# Patient Record
Sex: Female | Born: 1980 | Race: Black or African American | Hispanic: No | Marital: Single | Smoking: Former smoker
Health system: Southern US, Community
[De-identification: ages and names within clinical notes are randomized; demographics above are authoritative.]

## PROBLEM LIST (undated history)

## (undated) DIAGNOSIS — R011 Cardiac murmur, unspecified: Secondary | ICD-10-CM

## (undated) DIAGNOSIS — Q369 Cleft lip, unilateral: Secondary | ICD-10-CM

## (undated) DIAGNOSIS — N390 Urinary tract infection, site not specified: Secondary | ICD-10-CM

## (undated) HISTORY — PX: ANKLE SURGERY: SHX546

## (undated) HISTORY — PX: RECTAL SURGERY: SHX760

## (undated) HISTORY — PX: MANDIBLE SURGERY: SHX707

## (undated) HISTORY — PX: FACIAL RECONSTRUCTION SURGERY: SHX631

## (undated) HISTORY — PX: CLEFT LIP REPAIR: SUR1164

---

## 2016-09-05 ENCOUNTER — Encounter: Payer: Self-pay | Admitting: Emergency Medicine

## 2016-09-05 ENCOUNTER — Emergency Department
Admission: EM | Admit: 2016-09-05 | Discharge: 2016-09-05 | Disposition: A | Discharge status: Home / Self Care | Payer: Self-pay | Attending: Emergency Medicine | Admitting: Emergency Medicine

## 2016-09-05 DIAGNOSIS — J029 Acute pharyngitis, unspecified: Secondary | ICD-10-CM | POA: Insufficient documentation

## 2016-09-05 DIAGNOSIS — F17200 Nicotine dependence, unspecified, uncomplicated: Secondary | ICD-10-CM | POA: Insufficient documentation

## 2016-09-05 DIAGNOSIS — R300 Dysuria: Secondary | ICD-10-CM | POA: Insufficient documentation

## 2016-09-05 DIAGNOSIS — R1030 Lower abdominal pain, unspecified: Secondary | ICD-10-CM | POA: Insufficient documentation

## 2016-09-05 HISTORY — DX: Cleft lip, unilateral: Q36.9

## 2016-09-05 HISTORY — DX: Cardiac murmur, unspecified: R01.1

## 2016-09-05 HISTORY — DX: Urinary tract infection, site not specified: N39.0

## 2016-09-05 LAB — CBC
HEMATOCRIT: 38.1 % (ref 35.0–47.0)
Hemoglobin: 13.1 g/dL (ref 12.0–16.0)
MCH: 30.6 pg (ref 26.0–34.0)
MCHC: 34.4 g/dL (ref 32.0–36.0)
MCV: 88.9 fL (ref 80.0–100.0)
Platelets: 288 10*3/uL (ref 150–440)
RBC: 4.29 MIL/uL (ref 3.80–5.20)
RDW: 12.8 % (ref 11.5–14.5)
WBC: 6.7 10*3/uL (ref 3.6–11.0)

## 2016-09-05 LAB — URINALYSIS, COMPLETE (UACMP) WITH MICROSCOPIC
BACTERIA UA: NONE SEEN
Bilirubin Urine: NEGATIVE
Glucose, UA: NEGATIVE mg/dL
Hgb urine dipstick: NEGATIVE
KETONES UR: NEGATIVE mg/dL
LEUKOCYTES UA: NEGATIVE
Nitrite: NEGATIVE
PH: 5 (ref 5.0–8.0)
Protein, ur: NEGATIVE mg/dL
Specific Gravity, Urine: 1.02 (ref 1.005–1.030)

## 2016-09-05 LAB — COMPREHENSIVE METABOLIC PANEL
ALT: 17 U/L (ref 14–54)
AST: 20 U/L (ref 15–41)
Albumin: 4.1 g/dL (ref 3.5–5.0)
Alkaline Phosphatase: 70 U/L (ref 38–126)
Anion gap: 5 (ref 5–15)
BUN: 11 mg/dL (ref 6–20)
CHLORIDE: 105 mmol/L (ref 101–111)
CO2: 29 mmol/L (ref 22–32)
Calcium: 9 mg/dL (ref 8.9–10.3)
Creatinine, Ser: 0.96 mg/dL (ref 0.44–1.00)
Glucose, Bld: 89 mg/dL (ref 65–99)
POTASSIUM: 4.1 mmol/L (ref 3.5–5.1)
Sodium: 139 mmol/L (ref 135–145)
Total Bilirubin: 0.3 mg/dL (ref 0.3–1.2)
Total Protein: 8 g/dL (ref 6.5–8.1)

## 2016-09-05 LAB — POCT RAPID STREP A: STREPTOCOCCUS, GROUP A SCREEN (DIRECT): NEGATIVE

## 2016-09-05 LAB — LIPASE, BLOOD: LIPASE: 28 U/L (ref 11–51)

## 2016-09-05 MED ORDER — PHENAZOPYRIDINE HCL 200 MG PO TABS: 200.0000 mg | ORAL_TABLET | Freq: Three times a day (TID) | ORAL | 0 refills | Status: AC | PRN

## 2016-09-05 NOTE — ED Triage Notes (Signed)
Pt comes into the ED via POV c/o lower abdominal pain and sore throat.  Patient completed antibiotics 4-5 days ago for recurrent UTI.  Patient has been having the abdominal pain since then.  States there is no discharge that she has noticed, but she has problems with odor.  States she has burning with urination.  Denies any worry for STD's at this time.  Patient in NAD at this time.

## 2016-09-05 NOTE — ED Provider Notes (Signed)
[facility] Emergency Department Provider Note  ____________________________________________  Time seen: Approximately 5:19 PM  I have reviewed the triage vital signs and the nursing notes.   HISTORY  Chief Complaint Abdominal Pain and Sore Throat   HPI Sabrina Luna is a 35 y.o. female with a history of recurrent urinary tract infections and presents for concern of urinary tract infection. Patient last took antibiotics and the beginning of November. For the last 3 days she noticed worsening dysuria and mild intermittent suprapubic cramping pain radiating to her lower back. The pain is only present when she urinates and according to her this how she feels every time she has a urinary tract infection. She has no pain at this time. No fever or chills, no nausea or vomiting, no chest pain or shortness of breath, no hematuria, no h/o kidney stones, no flank pain, no vaginal discharge. Patient also complaining of a sore throat that started earlier today. She hasn't tried anything at home for the pain. No congestion or body aches. No coughing.   Past Medical History:  Diagnosis Date  . Cleft lip   . Heart murmur   . Recurrent urinary tract infection     There are no active problems to display for this patient.   Past Surgical History:  Procedure Laterality Date  . ANKLE SURGERY    . CLEFT LIP REPAIR    . FACIAL RECONSTRUCTION SURGERY    . MANDIBLE SURGERY    . RECTAL SURGERY      Prior to Admission medications   Medication Sig Start Date End Date Taking? Authorizing Provider  phenazopyridine (PYRIDIUM) 200 MG tablet Take 1 tablet (200 mg total) by mouth 3 (three) times daily as needed for pain. 09/05/16 09/05/17  Nita Sicklearolina Jamirra Curnow, MD    Allergies Adhesive [tape]; Cherry; Latex; Penicillins; and Tomato  No family history on file.  Social History Social History  Substance Use Topics  . Smoking status: Current Some Day Smoker  . Smokeless tobacco:  Never Used  . Alcohol use Yes     Comment: occasional    Review of Systems  Constitutional: Negative for fever. Eyes: Negative for visual changes. ENT: + sore throat. Neck: No neck pain  Cardiovascular: Negative for chest pain. Respiratory: Negative for shortness of breath. Gastrointestinal: + suprapubic abdominal pain. No vomiting or diarrhea. Genitourinary: + dysuria. Musculoskeletal: Negative for back pain. Skin: Negative for rash. Neurological: Negative for headaches, weakness or numbness. Psych: No SI or HI  ____________________________________________   PHYSICAL EXAM:  VITAL SIGNS: ED Triage Vitals  Enc Vitals Group     BP 09/05/16 1628 120/66     Pulse Rate 09/05/16 1628 77     Resp 09/05/16 1628 16     Temp 09/05/16 1628 98.4 F (36.9 C)     Temp Source 09/05/16 1628 Oral     SpO2 09/05/16 1628 100 %     Weight 09/05/16 1629 210 lb (95.3 kg)     Height 09/05/16 1629 5' 2.5" (1.588 m)     Head Circumference --      Peak Flow --      Pain Score 09/05/16 1638 6     Pain Loc --      Pain Edu? --      Excl. in GC? --     Constitutional: Alert and oriented. Well appearing and in no apparent distress. HEENT:      Head: Normocephalic and atraumatic.  Eyes: Conjunctivae are normal. Sclera is non-icteric. EOMI. PERRL      Mouth/Throat: Mucous membranes are moist. Oropharynx is clear with no erythema, peritonsillar abscess, or exudates      Neck: Supple with no signs of meningismus. Cardiovascular: Regular rate and rhythm. No murmurs, gallops, or rubs. 2+ symmetrical distal pulses are present in all extremities. No JVD. Respiratory: Normal respiratory effort. Lungs are clear to auscultation bilaterally. No wheezes, crackles, or rhonchi.  Gastrointestinal: Soft, non tender, and non distended with positive bowel sounds. No rebound or guarding. Genitourinary: No CVA tenderness. Musculoskeletal: Nontender with normal range of motion in all extremities. No edema,  cyanosis, or erythema of extremities. Neurologic: Normal speech and language. Face is symmetric. Moving all extremities. No gross focal neurologic deficits are appreciated. Skin: Skin is warm, dry and intact. No rash noted. Psychiatric: Mood and affect are normal. Speech and behavior are normal.  ____________________________________________   LABS (all labs ordered are listed, but only abnormal results are displayed)  Labs Reviewed  URINALYSIS, COMPLETE (UACMP) WITH MICROSCOPIC - Abnormal; Notable for the following:       Result Value   Color, Urine YELLOW (*)    APPearance CLEAR (*)    Squamous Epithelial / LPF 0-5 (*)    All other components within normal limits  CULTURE, GROUP A STREP (THRC)  URINE CULTURE  LIPASE, BLOOD  COMPREHENSIVE METABOLIC PANEL  CBC  POCT RAPID STREP A   ____________________________________________  EKG  none ____________________________________________  RADIOLOGY  none ____________________________________________   PROCEDURES  Procedure(s) performed: None Procedures Critical Care performed:  None ____________________________________________   INITIAL IMPRESSION / ASSESSMENT AND PLAN / ED COURSE  35 y.o. female with a history of recurrent urinary tract infections and presents for evaluation of intermittent suprapubic cramping pain and dysuria. Patient is well-appearing, in no distress, has normal vital signs, abdominal exam with no tenderness, no flank tenderness. 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 I did explain to her that her discomfort could be from a STD however she denies any concerns about STDs and does not wish to pursue pelvic exam at this time. She has no adnexal ttp and no pain at this time. Will check UA, strep swab, and basic labs.  Clinical Course as of Sep 06 1911  Wynelle LinkSun Sep 05, 2016  1911 Strep is negative, UA negative, blood work is negative. We'll discharge patient home on Pyridium and follow-up  with PCP.  [CV]    Clinical Course User Index [CV] Nita Sicklearolina Kameka Whan, MD    Pertinent labs & imaging results that were available during my care of the patient were reviewed by me and considered in my medical decision making (see chart for details).    ____________________________________________   FINAL CLINICAL IMPRESSION(S) / ED DIAGNOSES  Final diagnoses:  Dysuria      NEW MEDICATIONS STARTED DURING THIS VISIT:  New Prescriptions   PHENAZOPYRIDINE (PYRIDIUM) 200 MG TABLET    Take 1 tablet (200 mg total) by mouth 3 (three) times daily as needed for pain.     Note:  This document was prepared using Dragon voice recognition software and may include unintentional dictation errors.    Nita Sicklearolina Tadd Holtmeyer, MD 09/05/16 [phone]

## 2016-09-05 NOTE — ED Notes (Signed)
ED Provider at bedside. 

## 2016-09-07 LAB — URINE CULTURE

## 2016-09-09 LAB — CULTURE, GROUP A STREP (THRC)

## 2017-01-13 ENCOUNTER — Encounter: Payer: Self-pay | Admitting: Emergency Medicine

## 2017-01-13 ENCOUNTER — Emergency Department
Admission: EM | Admit: 2017-01-13 | Discharge: 2017-01-13 | Disposition: A | Discharge status: Home / Self Care | Payer: Self-pay | Attending: Emergency Medicine | Admitting: Emergency Medicine

## 2017-01-13 DIAGNOSIS — F17290 Nicotine dependence, other tobacco product, uncomplicated: Secondary | ICD-10-CM | POA: Insufficient documentation

## 2017-01-13 DIAGNOSIS — G589 Mononeuropathy, unspecified: Secondary | ICD-10-CM | POA: Insufficient documentation

## 2017-01-13 DIAGNOSIS — N3000 Acute cystitis without hematuria: Secondary | ICD-10-CM | POA: Insufficient documentation

## 2017-01-13 DIAGNOSIS — Z79899 Other long term (current) drug therapy: Secondary | ICD-10-CM | POA: Insufficient documentation

## 2017-01-13 DIAGNOSIS — R202 Paresthesia of skin: Secondary | ICD-10-CM

## 2017-01-13 LAB — URINALYSIS, COMPLETE (UACMP) WITH MICROSCOPIC
BILIRUBIN URINE: NEGATIVE
GLUCOSE, UA: NEGATIVE mg/dL
Hgb urine dipstick: NEGATIVE
KETONES UR: NEGATIVE mg/dL
NITRITE: NEGATIVE
PROTEIN: 30 mg/dL — AB
Specific Gravity, Urine: 1.018 (ref 1.005–1.030)
pH: 5 (ref 5.0–8.0)

## 2017-01-13 LAB — BASIC METABOLIC PANEL
Anion gap: 6 (ref 5–15)
BUN: 14 mg/dL (ref 6–20)
CALCIUM: 8.7 mg/dL — AB (ref 8.9–10.3)
CO2: 28 mmol/L (ref 22–32)
CREATININE: 0.8 mg/dL (ref 0.44–1.00)
Chloride: 105 mmol/L (ref 101–111)
GFR calc non Af Amer: >60 mL/min (ref >60)
Glucose, Bld: 105 mg/dL — ABNORMAL HIGH (ref 65–99)
Potassium: 3.8 mmol/L (ref 3.5–5.1)
SODIUM: 139 mmol/L (ref 135–145)

## 2017-01-13 LAB — CBC
HCT: 36.1 % (ref 35.0–47.0)
Hemoglobin: 12.6 g/dL (ref 12.0–16.0)
MCH: 31.1 pg (ref 26.0–34.0)
MCHC: 34.9 g/dL (ref 32.0–36.0)
MCV: 89.3 fL (ref 80.0–100.0)
Platelets: 289 10*3/uL (ref 150–440)
RBC: 4.04 MIL/uL (ref 3.80–5.20)
RDW: 12.7 % (ref 11.5–14.5)
WBC: 7.4 10*3/uL (ref 3.6–11.0)

## 2017-01-13 MED ORDER — CEPHALEXIN 500 MG PO CAPS
500.0000 mg | ORAL_CAPSULE | Freq: Once | ORAL | Status: AC
  Administered 2017-01-13: 500 mg via ORAL

## 2017-01-13 MED ORDER — CEPHALEXIN 500 MG PO CAPS: 500.0000 mg | ORAL_CAPSULE | Freq: Four times a day (QID) | ORAL | 0 refills | Status: AC

## 2017-01-13 MED ORDER — CEPHALEXIN 500 MG PO CAPS
ORAL_CAPSULE | ORAL | Status: AC
  Administered 2017-01-13: 500 mg via ORAL
  Filled 2017-01-13: qty 1

## 2017-01-13 NOTE — ED Triage Notes (Signed)
Pt presents to ED with c/o left arm numbness this afternoon after taking a nap. Pt reports she has a hx of intermittent arm numbness and the past couple of weeks numbness has worsened and become more consistent. Pt states symptoms typically "happen in the spring time and doesn't happen any other time". Also only happens right after waking up. Pt denies pain and states the numbness has improved and is only located in her fingers now. Pt talkative and alert with no distress noted. Pt states she thinks maybe she has an infection at this time because her urine is dark and has an odor. Denies pain with urination.

## 2017-01-13 NOTE — ED Notes (Signed)
Pt discharged home after verbalizing understanding of discharge instructions; nad noted. 

## 2017-01-13 NOTE — ED Notes (Signed)
Resumed care from [place], [place]. Pt resting comfortably. No needs expressed at this time. Will continue to monitor.
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
## 2017-01-15 LAB — URINE CULTURE

## 2017-02-20 ENCOUNTER — Encounter: Payer: Self-pay | Admitting: Emergency Medicine

## 2017-02-20 ENCOUNTER — Emergency Department
Admission: EM | Admit: 2017-02-20 | Discharge: 2017-02-20 | Disposition: A | Discharge status: Home / Self Care | Payer: Self-pay | Attending: Emergency Medicine | Admitting: Emergency Medicine

## 2017-02-20 DIAGNOSIS — Y939 Activity, unspecified: Secondary | ICD-10-CM | POA: Insufficient documentation

## 2017-02-20 DIAGNOSIS — X101XXA Contact with hot food, initial encounter: Secondary | ICD-10-CM | POA: Insufficient documentation

## 2017-02-20 DIAGNOSIS — F17290 Nicotine dependence, other tobacco product, uncomplicated: Secondary | ICD-10-CM | POA: Insufficient documentation

## 2017-02-20 DIAGNOSIS — T2112XA Burn of first degree of abdominal wall, initial encounter: Secondary | ICD-10-CM

## 2017-02-20 DIAGNOSIS — Y999 Unspecified external cause status: Secondary | ICD-10-CM | POA: Insufficient documentation

## 2017-02-20 DIAGNOSIS — T2122XA Burn of second degree of abdominal wall, initial encounter: Secondary | ICD-10-CM

## 2017-02-20 DIAGNOSIS — Y929 Unspecified place or not applicable: Secondary | ICD-10-CM | POA: Insufficient documentation

## 2017-02-20 MED ORDER — NAPROXEN 500 MG PO TABS
500.0000 mg | ORAL_TABLET | Freq: Once | ORAL | Status: AC
  Administered 2017-02-20: 500 mg via ORAL
  Filled 2017-02-20: qty 1

## 2017-02-20 MED ORDER — SILVER SULFADIAZINE 1 % EX CREA
TOPICAL_CREAM | Freq: Once | CUTANEOUS | Status: AC
  Administered 2017-02-20: 18:00:00 via TOPICAL
  Filled 2017-02-20 (×2): qty 85

## 2017-02-20 MED ORDER — SILVER SULFADIAZINE 1 % EX CREA: TOPICAL_CREAM | CUTANEOUS | 1 refills | Status: DC
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
## 2017-03-31 DIAGNOSIS — Z5321 Procedure and treatment not carried out due to patient leaving prior to being seen by health care provider: Secondary | ICD-10-CM | POA: Insufficient documentation

## 2017-03-31 DIAGNOSIS — R197 Diarrhea, unspecified: Secondary | ICD-10-CM | POA: Insufficient documentation

## 2017-04-01 ENCOUNTER — Emergency Department
Admission: EM | Admit: 2017-04-01 | Discharge: 2017-04-01 | Disposition: A | Discharge status: Home / Self Care | Payer: Self-pay | Attending: Emergency Medicine | Admitting: Emergency Medicine

## 2017-04-01 ENCOUNTER — Encounter: Payer: Self-pay | Admitting: Emergency Medicine

## 2017-04-01 DIAGNOSIS — R197 Diarrhea, unspecified: Secondary | ICD-10-CM | POA: Insufficient documentation

## 2017-04-01 DIAGNOSIS — N390 Urinary tract infection, site not specified: Secondary | ICD-10-CM | POA: Insufficient documentation

## 2017-04-01 DIAGNOSIS — F17290 Nicotine dependence, other tobacco product, uncomplicated: Secondary | ICD-10-CM | POA: Insufficient documentation

## 2017-04-01 LAB — URINALYSIS, COMPLETE (UACMP) WITH MICROSCOPIC
Bilirubin Urine: NEGATIVE
GLUCOSE, UA: NEGATIVE mg/dL
HGB URINE DIPSTICK: NEGATIVE
Ketones, ur: NEGATIVE mg/dL
NITRITE: NEGATIVE
PH: 6 (ref 5.0–8.0)
Protein, ur: NEGATIVE mg/dL
Specific Gravity, Urine: 1.02 (ref 1.005–1.030)

## 2017-04-01 LAB — CBC
HCT: 34.5 % — ABNORMAL LOW (ref 35.0–47.0)
Hemoglobin: 11.7 g/dL — ABNORMAL LOW (ref 12.0–16.0)
MCH: 29.6 pg (ref 26.0–34.0)
MCHC: 33.8 g/dL (ref 32.0–36.0)
MCV: 87.5 fL (ref 80.0–100.0)
PLATELETS: 285 10*3/uL (ref 150–440)
RBC: 3.95 MIL/uL (ref 3.80–5.20)
RDW: 13 % (ref 11.5–14.5)
WBC: 6.5 10*3/uL (ref 3.6–11.0)

## 2017-04-01 LAB — BASIC METABOLIC PANEL
Anion gap: 7 (ref 5–15)
BUN: 10 mg/dL (ref 6–20)
CHLORIDE: 104 mmol/L (ref 101–111)
CO2: 30 mmol/L (ref 22–32)
CREATININE: 0.85 mg/dL (ref 0.44–1.00)
Calcium: 8.9 mg/dL (ref 8.9–10.3)
GFR calc Af Amer: >60 mL/min (ref >60)
GFR calc non Af Amer: >60 mL/min (ref >60)
Glucose, Bld: 101 mg/dL — ABNORMAL HIGH (ref 65–99)
POTASSIUM: 3.3 mmol/L — AB (ref 3.5–5.1)
Sodium: 141 mmol/L (ref 135–145)

## 2017-04-01 LAB — POCT PREGNANCY, URINE: Preg Test, Ur: NEGATIVE

## 2017-04-01 MED ORDER — NITROFURANTOIN MONOHYD MACRO 100 MG PO CAPS
100.0000 mg | ORAL_CAPSULE | Freq: Once | ORAL | Status: AC
  Administered 2017-04-01: 100 mg via ORAL
  Filled 2017-04-01: qty 1

## 2017-04-01 MED ORDER — NITROFURANTOIN MONOHYD MACRO 100 MG PO CAPS: 100.0000 mg | ORAL_CAPSULE | Freq: Two times a day (BID) | ORAL | 0 refills | Status: AC

## 2017-04-01 NOTE — ED Notes (Signed)
Pt to front desk, pt sts she cant wait any longer but wants a work note. Pt informed she would need to see ED physician to obtain a work note. Pt refusing to stay.

## 2017-04-01 NOTE — ED Notes (Signed)
Dr Lenard LancePaduchowski and Gwynneth MunsonButch, RN at bedside at this time.

## 2017-04-01 NOTE — ED Triage Notes (Signed)
Pt ambulatory to triage with steady gait, no distress noted. Pt c/o N/V/D x2 days with increased weakness today.

## 2017-04-01 NOTE — ED Provider Notes (Signed)
[facility] Emergency Department Provider Note  Time seen: 8:59 PM  I have reviewed the triage vital signs and the nursing notes.   HISTORY  Chief Complaint Nausea; Emesis; and Diarrhea    HPI Sabrina Luna is a 35 y.o. female who presents to the emergency department for nausea, dry heaving, diarrhea for the past 2-3 days along with foul-smelling urine. Patient denies any fever. States a history of recurrent urinary tract infections however has not followed up with urology. Patient states she was just prescribed a round of Keflex at the beginning of June but states she stopped taking the medication after 1-2 days. Patient denies any abdominal pain. Denies dysuria. Does state dark and foul-smelling urine. Patient came to the emergency department late last night for evaluation but after waiting she left before being seen.  Past Medical History:  Diagnosis Date  . Cleft lip   . Heart murmur   . Recurrent urinary tract infection     There are no active problems to display for this patient.   Past Surgical History:  Procedure Laterality Date  . ANKLE SURGERY    . CLEFT LIP REPAIR    . FACIAL RECONSTRUCTION SURGERY    . MANDIBLE SURGERY    . RECTAL SURGERY      Prior to Admission medications   Medication Sig Start Date End Date Taking? Authorizing Provider  phenazopyridine (PYRIDIUM) 200 MG tablet Take 1 tablet (200 mg total) by mouth 3 (three) times daily as needed for pain. 09/05/16 09/05/17  Nita Sickle, MD  silver sulfADIAZINE (SILVADENE) 1 % cream Apply to affected area daily 02/20/17 02/20/18  Little, Traci M, PA-C    Allergies  Allergen Reactions  . Cherry Anaphylaxis  . Penicillins Anaphylaxis    Patient can take amoxicillin  . Tomato Anaphylaxis  . Adhesive [Tape] Hives    Silk Tape  . Latex Hives    No family history on file.  Social History Social History  Substance Use Topics  . Smoking status: Current Some Day Smoker    Types:  Cigars  . Smokeless tobacco: Never Used  . Alcohol use Yes     Comment: occasional    Review of Systems Constitutional: Negative for fever. Cardiovascular: Negative for chest pain. Respiratory: Negative for shortness of breath. Gastrointestinal: Negative for abdominal pain. Positive for nausea, dry heaves and diarrhea. Genitourinary: Negative for dysuria. Positive for dark urine and foul-smelling urine Musculoskeletal: Negative for back pain. Neurological: Negative for headache All other ROS negative  ____________________________________________   PHYSICAL EXAM:  VITAL SIGNS: ED Triage Vitals [04/01/17 1719]  Enc Vitals Group     BP 120/74     Pulse Rate 69     Resp 20     Temp 98.6 F (37 C)     Temp Source Oral     SpO2 100 %     Weight      Height      Head Circumference      Peak Flow      Pain Score      Pain Loc      Pain Edu?      Excl. in GC?     Constitutional: Alert and oriented. Well appearing and in no distress. Eyes: Normal exam ENT   Head: Normocephalic and atraumatic.   Mouth/Throat: Mucous membranes are moist. Cardiovascular: Normal rate, regular rhythm. No murmur Respiratory: Normal respiratory effort without tachypnea nor retractions. Breath sounds are clear  Gastrointestinal: Soft and nontender. No  distention.   Musculoskeletal: Nontender with normal range of motion in all extremities.  Neurologic:  Normal speech and language. No gross focal neurologic deficits Skin:  Skin is warm, dry and intact.  Psychiatric: Mood and affect are normal.  ____________________________________________   INITIAL IMPRESSION / ASSESSMENT AND PLAN / ED COURSE  Pertinent labs & imaging results that were available during my care of the patient were reviewed by me and considered in my medical decision making (see chart for details).  Patient presents to emergency department for nausea and dry heaves and diarrhea for the past several days. States 3 or 4  days of dark foul-smelling urine history of recurring urinary tract infections. Patient's urinalysis is consistent with a urinary tract infection, we will send a urine culture. Patient's lab work for him early this morning is largely within normal limits including a normal white blood cell count as well as a normal creatinine. I discussed with the patient placing an IV for IV antibiotics and fluids well in the emergency department. Patient strongly prefers to avoid an IV and is requesting an oral antibiotic. With patient's normal looking labs that believe it is safe and reasonable to prescribe an oral antibiotic and have the patient follow up with her primary care doctor. I discussed with the patient the importance of taking an antibiotic pertinent tire course even if feeling better. Patient is agreeable. We will also discharge with a urology recommendation for follow-up.  ____________________________________________   FINAL CLINICAL IMPRESSION(S) / ED DIAGNOSES  Diarrhea Urinary tract infection    Minna AntisPaduchowski, Jamayah Myszka, MD 04/01/17 2105

## 2017-04-01 NOTE — ED Triage Notes (Signed)
Pt with n/v/d for two days, was here last night for same and protocols completed and left due to the wait times.

## 2017-04-01 NOTE — Discharge Instructions (Signed)
Please call the number provided to follow-up with urology for further workup for your recurrent urinary tract infections. Please also call the number provided for [facility] to arrange a primary care physician appointment this coming week. Return to the emergency department for any abdominal pain, fever, or any other symptom personally concerning to yourself.

## 2017-04-04 LAB — URINE CULTURE: Culture: >=100000 — AB

## 2017-04-06 NOTE — Progress Notes (Signed)
36 y/o F d/c on Macrobid for UTI. Urine culture returned with Enterobacter resistant to nitrofurantoin. Dr. Nita Sicklearolina Veronese authroized a prescription for Bactrim DS bid x 7 days. Left VM for patient to call back 7/17 and 7/18.   Luisa HartScott Jasyah Theurer, PharmD Clinical Pharmacist

## 2017-04-07 ENCOUNTER — Telehealth: Payer: Self-pay | Admitting: Pharmacist

## 2017-04-07 NOTE — Telephone Encounter (Signed)
Called and left voicemail for pt x2 on 7/17 and 7/18 Pt Ucx growing enterobacter resistant to nitrofurantoin which the pt was d/c on.  Bactrim DS 1 tab BID x 7 days was authorized by Dr. Nita Sicklearolina Veronese.  Awaiting pt to return phone call to find out what pharmacy to call rx into.  Olene FlossMelissa D Kyanne Rials, Pharm.D, BCPS Clinical Pharmacist

## 2017-09-21 ENCOUNTER — Encounter: Payer: Self-pay | Admitting: Obstetrics and Gynecology

## 2017-09-21 ENCOUNTER — Encounter: Payer: Self-pay | Admitting: Certified Nurse Midwife

## 2017-09-21 ENCOUNTER — Ambulatory Visit (INDEPENDENT_AMBULATORY_CARE_PROVIDER_SITE_OTHER): Payer: 59 | Admitting: Certified Nurse Midwife

## 2017-09-21 VITALS — BP 118/72 | HR 81 | Ht 62.5 in | Wt 203.0 lb

## 2017-09-21 DIAGNOSIS — L292 Pruritus vulvae: Secondary | ICD-10-CM | POA: Diagnosis not present

## 2017-09-21 DIAGNOSIS — Z87730 Personal history of (corrected) cleft lip and palate: Secondary | ICD-10-CM | POA: Insufficient documentation

## 2017-09-21 DIAGNOSIS — N939 Abnormal uterine and vaginal bleeding, unspecified: Secondary | ICD-10-CM | POA: Diagnosis not present

## 2017-09-21 DIAGNOSIS — Z113 Encounter for screening for infections with a predominantly sexual mode of transmission: Secondary | ICD-10-CM | POA: Diagnosis not present

## 2017-09-21 DIAGNOSIS — R55 Syncope and collapse: Secondary | ICD-10-CM

## 2017-09-21 DIAGNOSIS — N390 Urinary tract infection, site not specified: Secondary | ICD-10-CM | POA: Diagnosis not present

## 2017-09-21 LAB — POCT URINE PREGNANCY: Preg Test, Ur: NEGATIVE

## 2017-09-21 LAB — POCT WET PREP (WET MOUNT): TRICHOMONAS WET PREP HPF POC: ABSENT
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
## 2017-09-22 LAB — CBC WITH DIFFERENTIAL/PLATELET
BASOS: 0 %
Basophils Absolute: 0 10*3/uL (ref 0.0–0.2)
EOS (ABSOLUTE): 0.1 10*3/uL (ref 0.0–0.4)
EOS: 1 %
HEMATOCRIT: 37.1 % (ref 34.0–46.6)
HEMOGLOBIN: 12.4 g/dL (ref 11.1–15.9)
IMMATURE GRANS (ABS): 0 10*3/uL (ref 0.0–0.1)
IMMATURE GRANULOCYTES: 0 %
LYMPHS: 45 %
Lymphocytes Absolute: 2.6 10*3/uL (ref 0.7–3.1)
MCH: 29.5 pg (ref 26.6–33.0)
MCHC: 33.4 g/dL (ref 31.5–35.7)
MCV: 88 fL (ref 79–97)
MONOCYTES: 8 %
Monocytes Absolute: 0.5 10*3/uL (ref 0.1–0.9)
NEUTROS PCT: 46 %
Neutrophils Absolute: 2.7 10*3/uL (ref 1.4–7.0)
Platelets: 315 10*3/uL (ref 150–379)
RBC: 4.2 x10E6/uL (ref 3.77–5.28)
RDW: 13.4 % (ref 12.3–15.4)
WBC: 5.8 10*3/uL (ref 3.4–10.8)

## 2017-09-22 LAB — BASIC METABOLIC PANEL
BUN/Creatinine Ratio: 14 (ref 9–23)
BUN: 12 mg/dL (ref 6–20)
CALCIUM: 9.1 mg/dL (ref 8.7–10.2)
CHLORIDE: 105 mmol/L (ref 96–106)
CO2: 22 mmol/L (ref 20–29)
CREATININE: 0.87 mg/dL (ref 0.57–1.00)
GFR calc non Af Amer: 87 mL/min/{1.73_m2} (ref >59)
GFR, EST AFRICAN AMERICAN: 101 mL/min/{1.73_m2} (ref >59)
Glucose: 99 mg/dL (ref 65–99)
Potassium: 4 mmol/L (ref 3.5–5.2)
Sodium: 145 mmol/L — ABNORMAL HIGH (ref 134–144)

## 2017-09-22 LAB — CHLAMYDIA/GONOCOCCUS/TRICHOMONAS, NAA
Chlamydia by NAA: NEGATIVE
Gonococcus by NAA: NEGATIVE
TRICH VAG BY NAA: NEGATIVE

## 2017-09-22 LAB — BETA HCG QUANT (REF LAB): hCG Quant: <1 m[IU]/mL

## 2017-09-26 ENCOUNTER — Telehealth: Payer: Self-pay | Admitting: Certified Nurse Midwife

## 2017-09-26 ENCOUNTER — Telehealth: Payer: Self-pay

## 2017-09-26 NOTE — Telephone Encounter (Signed)
Patient aware that results essentially normal (sodium slightly elevated). Has not had time to go thru papers to see what hospital she went to in [place] because of family visiting.  Recommend either getting records from that hospital or getting a pelvic ultrasound done here. She will let me know what she decides to do. She is also looking for a PCP

## 2017-09-26 NOTE — Telephone Encounter (Signed)
Pt aware all labs WNL. Just FYI, awaiting sign off so they are visible in MyChart.

## 2017-09-26 NOTE — Telephone Encounter (Signed)
Pt requesting lab results from 09/21/17 office visit w/[facility]

## 2018-09-21 ENCOUNTER — Emergency Department
Admission: EM | Admit: 2018-09-21 | Discharge: 2018-09-21 | Disposition: A | Discharge status: Home / Self Care | Payer: 59 | Attending: Emergency Medicine | Admitting: Emergency Medicine

## 2018-09-21 ENCOUNTER — Encounter: Payer: Self-pay | Admitting: Emergency Medicine

## 2018-09-21 ENCOUNTER — Other Ambulatory Visit: Payer: Self-pay

## 2018-09-21 ENCOUNTER — Emergency Department: Payer: 59

## 2018-09-21 DIAGNOSIS — Y999 Unspecified external cause status: Secondary | ICD-10-CM | POA: Insufficient documentation

## 2018-09-21 DIAGNOSIS — S9782XA Crushing injury of left foot, initial encounter: Secondary | ICD-10-CM | POA: Diagnosis present

## 2018-09-21 DIAGNOSIS — S9032XA Contusion of left foot, initial encounter: Secondary | ICD-10-CM | POA: Insufficient documentation

## 2018-09-21 DIAGNOSIS — Y929 Unspecified place or not applicable: Secondary | ICD-10-CM | POA: Diagnosis not present

## 2018-09-21 DIAGNOSIS — Z87891 Personal history of nicotine dependence: Secondary | ICD-10-CM | POA: Insufficient documentation

## 2018-09-21 DIAGNOSIS — W230XXA Caught, crushed, jammed, or pinched between moving objects, initial encounter: Secondary | ICD-10-CM | POA: Diagnosis not present

## 2018-09-21 DIAGNOSIS — Y939 Activity, unspecified: Secondary | ICD-10-CM | POA: Insufficient documentation

## 2018-09-21 MED ORDER — IBUPROFEN 800 MG PO TABS: 800.0000 mg | ORAL_TABLET | Freq: Three times a day (TID) | ORAL | 0 refills | Status: AC | PRN

## 2018-09-21 MED ORDER — IBUPROFEN 800 MG PO TABS
800.0000 mg | ORAL_TABLET | Freq: Once | ORAL | Status: AC
  Administered 2018-09-21: 800 mg via ORAL
  Filled 2018-09-21: qty 1
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
## 2018-09-29 ENCOUNTER — Encounter: Payer: Self-pay | Admitting: Advanced Practice Midwife

## 2018-09-29 ENCOUNTER — Other Ambulatory Visit (HOSPITAL_COMMUNITY)
Admission: RE | Admit: 2018-09-29 | Discharge: 2018-09-29 | Disposition: A | Discharge status: Home / Self Care | Payer: 59 | Source: Ambulatory Visit | Attending: Advanced Practice Midwife | Admitting: Advanced Practice Midwife

## 2018-09-29 ENCOUNTER — Ambulatory Visit (INDEPENDENT_AMBULATORY_CARE_PROVIDER_SITE_OTHER): Payer: 59 | Admitting: Advanced Practice Midwife

## 2018-09-29 VITALS — BP 126/60 | HR 75 | Ht 62.5 in | Wt 218.0 lb

## 2018-09-29 DIAGNOSIS — Z113 Encounter for screening for infections with a predominantly sexual mode of transmission: Secondary | ICD-10-CM | POA: Diagnosis not present

## 2018-09-29 DIAGNOSIS — Z3202 Encounter for pregnancy test, result negative: Secondary | ICD-10-CM

## 2018-09-29 DIAGNOSIS — N926 Irregular menstruation, unspecified: Secondary | ICD-10-CM

## 2018-09-29 LAB — POCT URINE PREGNANCY: Preg Test, Ur: NEGATIVE

## 2018-09-29 NOTE — Patient Instructions (Signed)
Vaginitis    Vaginitis is irritation and swelling (inflammation) of the vagina. It happens when normal bacteria and yeast in the vagina grow too much. There are many types of this condition. Treatment will depend on the type you have.  Follow these instructions at home:  Lifestyle  · Keep your vagina area clean and dry.  ? Avoid using soap.  ? Rinse the area with water.  · Do not do the following until your doctor says it is okay:  ? Wash and clean out the vagina (douche).  ? Use tampons.  ? Have sex.  · Wipe from front to back after going to the bathroom.  · Let air reach your vagina.  ? Wear cotton underwear.  ? Do not wear:  ? Underwear while you sleep.  ? Tight pants.  ? Thong underwear.  ? Underwear or nylons without a cotton panel.  ? Take off any wet clothing, such as bathing suits, as soon as possible.  · Use gentle, non-scented products. Do not use things that can irritate the vagina, such as fabric softeners. Avoid the following products if they are scented:  ? Feminine sprays.  ? Detergents.  ? Tampons.  ? Feminine hygiene products.  ? Soaps or bubble baths.  · Practice safe sex and use condoms.  General instructions  · Take over-the-counter and prescription medicines only as told by your doctor.  · If you were prescribed an antibiotic medicine, take or use it as told by your doctor. Do not stop taking or using the antibiotic even if you start to feel better.  · Keep all follow-up visits as told by your doctor. This is important.  Contact a doctor if:  · You have pain in your belly.  · You have a fever.  · Your symptoms last for more than 2-3 days.  Get help right away if:  · You have a fever and your symptoms get worse all of a sudden.  Summary  · Vaginitis is irritation and swelling of the vagina. It can happen when the normal bacteria and yeast in the vagina grow too much. There are many types.  · Treatment will depend on the type you have.  · Do not douche, use tampons , or have sex until your health  care provider approves. When you can return to sex, practice safe sex and use condoms.  This information is not intended to replace advice given to you by your health care provider. Make sure you discuss any questions you have with your health care provider.  Document Released: 12/03/2008 Document Revised: 09/28/2016 Document Reviewed: 09/28/2016  Elsevier Interactive Patient Education © 2019 [employer]

## 2018-09-29 NOTE — Progress Notes (Signed)
Patient ID: Sabrina Luna, female   DOB: [birth year], 38 y.o.   MRN: [id number]  Reason for Visit: STD check and Metrorrhagia (Pt requesting pregnancy test)     Subjective:     HPI:  Sabrina Luna is a 38 y.o. female reports learning that her boyfriend that she lives with has been having sexual relations with another woman for about the past month. She reports last intercourse with her boyfriend was October. She began having symptoms of itching about a month ago and thought it was due to a change of soap, but then she learned of her boyfriend's infidelity and she is requesting STD testing. Her periods occur approximately every 2-3 months and she has not had a period since November. She also requests a pregnancy test. She denies any current concerns of itching, irritation, discharge, burning or odor.  She has told her boyfriend that she will leave if he does not stop "sleeping around". 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   Past Medical History:  Diagnosis Date  . Cleft lip   . Heart murmur   . Recurrent urinary tract infection    Family History  Problem Relation Age of Onset  . Diabetes Mother   . Hypertension Mother   . Hyperlipidemia Mother   . Heart failure Mother   . Renal Disease Mother        stage 4 renal failure  . Sarcoidosis Mother   . Asthma Mother   . Congestive Heart Failure Mother   . Colon cancer Maternal Aunt   . Prostate cancer Maternal Grandfather   . Diabetes Paternal Grandmother   . Renal Disease Paternal Grandmother   . Congestive Heart Failure Paternal Grandmother   . Breast cancer Maternal Aunt    Past Surgical History:  Procedure Laterality Date  . ANKLE SURGERY    . CLEFT LIP REPAIR    . FACIAL RECONSTRUCTION SURGERY    . MANDIBLE SURGERY    . RECTAL SURGERY      Short Social History:  Social History   Tobacco Use  . Smoking status: Former Smoker    Types: Cigars  . Smokeless tobacco: Never Used  Substance Use Topics  . Alcohol use: Yes   Comment: occasional    Allergies  Allergen Reactions  . Cherry Anaphylaxis and Swelling    Tongue swelling  . Cherry Flavor Anaphylaxis  . Penicillins Anaphylaxis and Hives    Patient can take amoxicillin  . Tomato Anaphylaxis and Swelling  . Adhesive [Tape] Hives    Silk Tape  . Bactrim [Sulfamethoxazole-Trimethoprim]   . Latex Hives  . Povidone-Iodine     Also allergic to cheeries and silk tape    Current Outpatient Medications  Medication Sig Dispense Refill  . ibuprofen (ADVIL,MOTRIN) 800 MG tablet Take 1 tablet (800 mg total) by mouth every 8 (eight) hours as needed. 30 tablet 0   No current facility-administered medications for this visit.     Review of Systems  Constitutional:  Constitutional negative. HENT: HENT negative.  Eyes: Eyes negative.  Respiratory: Respiratory negative.  Cardiovascular: Cardiovascular negative.  GI: Gastrointestinal negative.  GU: Genitourinary negative. Musculoskeletal: Musculoskeletal negative.  Skin: Skin negative.  Neurological: Neurological negative. Hematologic: Hematologic/lymphatic negative.  Psychiatric: Psychiatric negative.        Objective:  Objective   Vitals:   09/29/18 1145  BP: 126/60  Pulse: 75  Weight: 218 lb (98.9 kg)  Height: 5' 2.5" (1.588 m)   Body mass index is 39.24 kg/m. Constitutional: Well  nourished, well developed female in no acute distress.  HEENT: normal Skin: Warm and dry.  Cardiovascular: Regular rate and rhythm.   Respiratory: Clear to auscultation bilateral. Normal respiratory effort Psych: Alert and Oriented x3. No memory deficits. Normal mood and affect.   Pelvic exam:  is not limited by body habitus but is limited by intolerance of exam. I was able to get Aptima swab specimen with difficulty EGBUS: within normal limits Vagina: within normal limits, normal mucosa Cervix: not evaluated  Results for KIMBERLIN, PALA (MRN [id number]) as of 09/29/2018 13:15  Ref. Range 09/29/2018 12:03    Preg Test, Ur Latest Ref Range: Negative  Negative       Assessment/Plan:     38 yo G5 P0050 in the office for STD testing, pregnancy test negative  STD testing Aptima STD panel   Tresea Mall CNM [facility], [facility]

## 2018-10-02 LAB — CERVICOVAGINAL ANCILLARY ONLY
CHLAMYDIA, DNA PROBE: NEGATIVE
NEISSERIA GONORRHEA: NEGATIVE
TRICH (WINDOWPATH): NEGATIVE

## 2018-10-03 LAB — RPR+HSVIGM+HBSAG+HSV2(IGG)+...
HEP B S AG: NEGATIVE
HIV Screen 4th Generation wRfx: NONREACTIVE
HSV 2 IGG, TYPE SPEC: 12.9 {index} — AB (ref 0.00–0.90)
HSVI/II Comb IgM: 2.75 Ratio — ABNORMAL HIGH (ref 0.00–0.90)
RPR Ser Ql: NONREACTIVE

## 2018-11-18 ENCOUNTER — Encounter: Payer: Self-pay | Admitting: Emergency Medicine

## 2018-11-18 ENCOUNTER — Other Ambulatory Visit: Payer: Self-pay

## 2018-11-18 ENCOUNTER — Emergency Department
Admission: EM | Admit: 2018-11-18 | Discharge: 2018-11-18 | Disposition: A | Discharge status: Home / Self Care | Payer: 59 | Attending: Emergency Medicine | Admitting: Emergency Medicine

## 2018-11-18 DIAGNOSIS — Z91040 Latex allergy status: Secondary | ICD-10-CM | POA: Insufficient documentation

## 2018-11-18 DIAGNOSIS — Z87891 Personal history of nicotine dependence: Secondary | ICD-10-CM | POA: Insufficient documentation

## 2018-11-18 DIAGNOSIS — J111 Influenza due to unidentified influenza virus with other respiratory manifestations: Secondary | ICD-10-CM | POA: Insufficient documentation

## 2018-11-18 NOTE — ED Notes (Signed)
Pt c/o fever at home of 103.3 and body aches since yesterday. Pt denies any vomiting or diarrhea.

## 2018-11-18 NOTE — ED Provider Notes (Signed)
[facility] Emergency Department Provider Note  ____________________________________________  Time seen: Approximately 7:45 PM  I have reviewed the triage vital signs and the nursing notes.   HISTORY  Chief Complaint Fever    HPI Sabrina Luna is a 38 y.o. female who presents the emergency department complaining of sudden onset of fever, chills, body aches, malaise.  Patient reports that her boyfriend had flu this past week and she developed symptoms yesterday.  Patient denies any sore throat or cough at this time.  No abdominal pain, nausea vomiting, diarrhea or constipation.  Patient has been using Advil at home.  No other medications prior to arrival.  No other complaints at this time.    Past Medical History:  Diagnosis Date  . Cleft lip   . Heart murmur   . Recurrent urinary tract infection     Patient Active Problem List   Diagnosis Date Noted  . H/O cleft lip 09/21/2017  . Recurrent urinary tract infection     Past Surgical History:  Procedure Laterality Date  . ANKLE SURGERY    . CLEFT LIP REPAIR    . FACIAL RECONSTRUCTION SURGERY    . MANDIBLE SURGERY    . RECTAL SURGERY      Prior to Admission medications   Medication Sig Start Date End Date Taking? Authorizing Provider  ibuprofen (ADVIL,MOTRIN) 800 MG tablet Take 1 tablet (800 mg total) by mouth every 8 (eight) hours as needed. 09/21/18   Darci Current, MD    Allergies Valentino Saxon; Cherry flavor; Penicillins; Tomato; Adhesive [tape]; Bactrim [sulfamethoxazole-trimethoprim]; Latex; and Povidone-iodine  Family History  Problem Relation Age of Onset  . Diabetes Mother   . Hypertension Mother   . Hyperlipidemia Mother   . Heart failure Mother   . Renal Disease Mother        stage 4 renal failure  . Sarcoidosis Mother   . Asthma Mother   . Congestive Heart Failure Mother   . Colon cancer Maternal Aunt   . Prostate cancer Maternal Grandfather   . Diabetes Paternal Grandmother   .  Renal Disease Paternal Grandmother   . Congestive Heart Failure Paternal Grandmother   . Breast cancer Maternal Aunt     Social History Social History   Tobacco Use  . Smoking status: Former Smoker    Types: Cigars  . Smokeless tobacco: Never Used  Substance Use Topics  . Alcohol use: Yes    Comment: occasional  . Drug use: No     Review of Systems  Constitutional: Positive fever/chills Eyes: No visual changes. No discharge ENT: Positive for nasal congestion Cardiovascular: no chest pain. Respiratory: no cough. No SOB. Gastrointestinal: No abdominal pain.  No nausea, no vomiting.  No diarrhea.  No constipation. Musculoskeletal: Negative for musculoskeletal pain. Skin: Negative for rash, abrasions, lacerations, ecchymosis. Neurological: Negative for headaches, focal weakness or numbness. 10-point ROS otherwise negative.  ____________________________________________   PHYSICAL EXAM:  VITAL SIGNS: ED Triage Vitals  Enc Vitals Group     BP 11/18/18 1829 126/77     Pulse Rate 11/18/18 1829 (!) 106     Resp 11/18/18 1829 18     Temp 11/18/18 1829 (!) 102.3 F (39.1 C)     Temp Source 11/18/18 1829 Oral     SpO2 11/18/18 1829 99 %     Weight --      Height --      Head Circumference --      Peak Flow --  Pain Score 11/18/18 1830 9     Pain Loc --      Pain Edu? --      Excl. in GC? --      Constitutional: Alert and oriented. Well appearing and in no acute distress. Eyes: Conjunctivae are normal. PERRL. EOMI. Head: Atraumatic. ENT:      Ears: EACs and TMs unremarkable bilaterally      Nose: Mild congestion/rhinnorhea.      Mouth/Throat: Mucous membranes are moist.  Visualize hard palate with findings consistent with previous cleft palate surgery.  No gross erythema or edema.  Uvula is midline. Neck: No stridor.  Neck is supple full range of motion Hematological/Lymphatic/Immunilogical: Scattered, mobile, nontender anterior cervical  lymphadenopathy. Cardiovascular: Normal rate, regular rhythm. Normal S1 and S2.  Good peripheral circulation. Respiratory: Normal respiratory effort without tachypnea or retractions. Lungs CTAB. Good air entry to the bases with no decreased or absent breath sounds. Musculoskeletal: Full range of motion to all extremities. No gross deformities appreciated. Neurologic:  Normal speech and language. No gross focal neurologic deficits are appreciated.  Skin:  Skin is warm, dry and intact. No rash noted. Psychiatric: Mood and affect are normal. Speech and behavior are normal. Patient exhibits appropriate insight and judgement.   ____________________________________________   LABS (all labs ordered are listed, but only abnormal results are displayed)  Labs Reviewed - No data to display ____________________________________________  EKG   ____________________________________________  RADIOLOGY   No results found.  ____________________________________________    PROCEDURES  Procedure(s) performed:    Procedures    Medications - No data to display   ____________________________________________   INITIAL IMPRESSION / ASSESSMENT AND PLAN / ED COURSE  Pertinent labs & imaging results that were available during my care of the patient were reviewed by me and considered in my medical decision making (see chart for details).  Review of the [place] CSRS was performed in accordance of the [facility] prior to dispensing any controlled drugs.      Patient's diagnosis is consistent with influenza.  Patient presents emergency department with sudden onset of flulike symptoms.  Patient has a known exposure to her boyfriend who was diagnosed with influenza.  At this time, patient will be diagnosed based off of symptoms with no testing performed.  Patient has no other concerning physical exam findings or symptoms warranting further investigation with labs or imaging.  I discussed Tamiflu with patient  and she opts for symptom control medication at home over Tamiflu..  Patient will use over-the-counter medications at this time.  No prescriptions.  Follow-up primary care as needed.  Patient is given ED precautions to return to the ED for any worsening or new symptoms.     ____________________________________________  FINAL CLINICAL IMPRESSION(S) / ED DIAGNOSES  Final diagnoses:  Influenza      NEW MEDICATIONS STARTED DURING THIS VISIT:  ED Discharge Orders    None          This chart was dictated using voice recognition software/Dragon. Despite best efforts to proofread, errors can occur which can change the meaning. Any change was purely unintentional.    Racheal Patches, PA-C 11/18/18 1948    Minna Antis, MD 11/18/18 2027

## 2018-11-18 NOTE — ED Triage Notes (Signed)
Pt to ed with c/o fever, cough, congestion since last night. Last dose tylenol was at 11 am.

## 2018-11-20 ENCOUNTER — Emergency Department
Admission: EM | Admit: 2018-11-20 | Discharge: 2018-11-20 | Disposition: A | Discharge status: Home / Self Care | Payer: Self-pay | Attending: Emergency Medicine | Admitting: Emergency Medicine

## 2018-11-20 ENCOUNTER — Encounter: Payer: Self-pay | Admitting: Emergency Medicine

## 2018-11-20 ENCOUNTER — Other Ambulatory Visit: Payer: Self-pay

## 2018-11-20 DIAGNOSIS — Z87891 Personal history of nicotine dependence: Secondary | ICD-10-CM | POA: Insufficient documentation

## 2018-11-20 DIAGNOSIS — Z91040 Latex allergy status: Secondary | ICD-10-CM | POA: Insufficient documentation

## 2018-11-20 DIAGNOSIS — T783XXA Angioneurotic edema, initial encounter: Secondary | ICD-10-CM | POA: Insufficient documentation

## 2018-11-20 MED ORDER — PREDNISONE 20 MG PO TABS: 60.0000 mg | ORAL_TABLET | Freq: Every day | ORAL | 0 refills | Status: AC

## 2018-11-20 MED ORDER — EPINEPHRINE 0.3 MG/0.3ML IJ SOAJ
0.3000 mg | Freq: Once | INTRAMUSCULAR | Status: AC
  Administered 2018-11-20: 0.3 mg via INTRAMUSCULAR
  Filled 2018-11-20: qty 0.3

## 2018-11-20 MED ORDER — METHYLPREDNISOLONE SODIUM SUCC 125 MG IJ SOLR
125.0000 mg | Freq: Once | INTRAMUSCULAR | Status: AC
  Administered 2018-11-20: 125 mg via INTRAVENOUS
  Filled 2018-11-20: qty 2

## 2018-11-20 NOTE — ED Provider Notes (Signed)
[facility] Emergency Department Provider Note ____________________________________________   First MD Initiated Contact with Patient 11/20/18 1943     (approximate)  I have reviewed the triage vital signs and the nursing notes.   HISTORY  Chief Complaint Allergic Reaction    HPI Sabrina Luna is a 38 y.o. female with PMH as noted below who presents with tongue swelling, acute onset earlier this morning, persistent since then but not worsening, associated with a change in her voice.  The patient also reports a few swollen papules on her nose and lip.  She denies any other itching or urticaria.  She has no shortness of breath.  The patient states she was diagnosed with the flu several days ago although declined Tamiflu.  She has still been running a low-grade temperature to around 100 and has had body aches.  No prior history of tongue swelling or angioedema.  She denies any new medications or allergic exposures.  Past Medical History:  Diagnosis Date  . Cleft lip   . Heart murmur   . Recurrent urinary tract infection     Patient Active Problem List   Diagnosis Date Noted  . H/O cleft lip 09/21/2017  . Recurrent urinary tract infection     Past Surgical History:  Procedure Laterality Date  . ANKLE SURGERY    . CLEFT LIP REPAIR    . FACIAL RECONSTRUCTION SURGERY    . MANDIBLE SURGERY    . RECTAL SURGERY      Prior to Admission medications   Medication Sig Start Date End Date Taking? Authorizing Provider  ibuprofen (ADVIL,MOTRIN) 800 MG tablet Take 1 tablet (800 mg total) by mouth every 8 (eight) hours as needed. 09/21/18  Yes Darci Current, MD  predniSONE (DELTASONE) 20 MG tablet Take 3 tablets (60 mg total) by mouth daily with breakfast for 3 days. 11/21/18 11/24/18  Dionne Bucy, MD    Allergies Valentino Saxon; Cherry flavor; Penicillins; Tomato; Adhesive [tape]; Bactrim [sulfamethoxazole-trimethoprim]; Latex; and Povidone-iodine  Family History    Problem Relation Age of Onset  . Diabetes Mother   . Hypertension Mother   . Hyperlipidemia Mother   . Heart failure Mother   . Renal Disease Mother        stage 4 renal failure  . Sarcoidosis Mother   . Asthma Mother   . Congestive Heart Failure Mother   . Colon cancer Maternal Aunt   . Prostate cancer Maternal Grandfather   . Diabetes Paternal Grandmother   . Renal Disease Paternal Grandmother   . Congestive Heart Failure Paternal Grandmother   . Breast cancer Maternal Aunt     Social History Social History   Tobacco Use  . Smoking status: Former Smoker    Types: Cigars  . Smokeless tobacco: Never Used  Substance Use Topics  . Alcohol use: Yes    Comment: occasional  . Drug use: No    Review of Systems  Constitutional: Positive for intermittent fever. Eyes: No redness or itching. ENT: No sore throat.  Positive for tongue swelling. Cardiovascular: Denies chest pain. Respiratory: Denies shortness of breath. Gastrointestinal: No vomiting or diarrhea.  Genitourinary: Negative for dysuria.  Musculoskeletal: Negative for back pain. Skin: Negative for rash. Neurological: Negative for headache.   ____________________________________________   PHYSICAL EXAM:  VITAL SIGNS: ED Triage Vitals  Enc Vitals Group     BP 11/20/18 1932 106/62     Pulse Rate 11/20/18 1932 94     Resp 11/20/18 1932 20  Temp 11/20/18 1932 100 F (37.8 C)     Temp Source 11/20/18 1932 Oral     SpO2 11/20/18 1932 99 %     Weight 11/20/18 1929 215 lb (97.5 kg)     Height 11/20/18 1929 5\' 2"  (1.575 m)     Head Circumference --      Peak Flow --      Pain Score 11/20/18 1929 0     Pain Loc --      Pain Edu? --      Excl. in GC? --     Constitutional: Alert and oriented.  Relatively well appearing and in no acute distress. Eyes: Conjunctivae are normal.  Head: Atraumatic. Nose: No congestion/rhinnorhea. Mouth/Throat: Mild swelling to tongue.  Oropharynx clear with no erythema,  exudate, or swelling. Neck: Normal range of motion.  Cardiovascular:  Good peripheral circulation. Respiratory: Normal respiratory effort.  No retractions. Lungs CTAB. Gastrointestinal: No distention.  Musculoskeletal: Extremities warm and well perfused.  Neurologic:  Normal speech and language. No gross focal neurologic deficits are appreciated.  Skin:  Skin is warm and dry.  1 cm papular/urticarial lesions to tip of nose and upper lip.  No mucosal lesions.  No other rash or urticaria. Psychiatric: Mood and affect are normal. Speech and behavior are normal.  ____________________________________________   LABS (all labs ordered are listed, but only abnormal results are displayed)  Labs Reviewed - No data to display ____________________________________________  EKG   ____________________________________________  RADIOLOGY    ____________________________________________   PROCEDURES  Procedure(s) performed: No  Procedures  Critical Care performed: No ____________________________________________   INITIAL IMPRESSION / ASSESSMENT AND PLAN / ED COURSE  Pertinent labs & imaging results that were available during my care of the patient were reviewed by me and considered in my medical decision making (see chart for details).  38 year old female with PMH as noted above presents with tongue swelling acute onset this morning associated with a change in her voice.  No prior history of angioedema.  The patient states that she currently has influenza.  I reviewed the past medical records in Epic.  The patient was seen in the ED on 11/18/2018 with flulike symptoms and was clinically diagnosed with presumptive influenza.  She declined Tamiflu.  On exam the patient is well-appearing and her vital signs are normal except for borderline temperature.  Her tongue appears possibly very mildly swollen but the oropharynx is clear and the remainder of the exam is as described above.  Overall  presentation is consistent with mild angioedema, most likely precipitated by viral infection.  Since the patient states that her voice is affected and does have some tongue swelling, I will give epinephrine and Decadron.  She states that she has had a rash before with Benadryl so I will avoid antihistamine at this time.  I will observe the patient in the ED for a few hours and discharge home if no worsening.  ----------------------------------------- 9:05 PM on 11/20/2018 -----------------------------------------  The patient states that her swelling is possibly a little bit better.  She talked with her mother on the phone and the mother told her that her voice sounds better.  We will observe for an additional hour.  I signed the patient out to the oncoming physician Dr. Mayford Knife. ____________________________________________   FINAL CLINICAL IMPRESSION(S) / ED DIAGNOSES  Final diagnoses:  Angioedema, initial encounter      NEW MEDICATIONS STARTED DURING THIS VISIT:  New Prescriptions   PREDNISONE (DELTASONE) 20 MG TABLET  Take 3 tablets (60 mg total) by mouth daily with breakfast for 3 days.     Note:  This document was prepared using Dragon voice recognition software and may include unintentional dictation errors.    Dionne Bucy, MD 11/20/18 2105

## 2018-11-20 NOTE — ED Provider Notes (Signed)
On repeat evaluation, patient looks well.  I do not see signs of further angioedema or allergic reaction.  She is cleared for outpatient follow-up as scheduled.   Emily Filbert, MD 11/20/18 2214

## 2018-11-20 NOTE — ED Triage Notes (Signed)
Patient ambulatory to triage with steady gait, without difficulty or distress noted; pt reports since last night having swelling to tongue with no known cause

## 2018-11-20 NOTE — Discharge Instructions (Signed)
Take the prednisone as prescribed for the next 3 days after your ER visit.  You can continue Tylenol or Motrin as needed for fever or body aches.  Return to the ER immediately for new, worsening, or persistent tongue swelling, rash, itching, difficulty breathing or swallowing, or any other new or worsening symptoms that concern you.

## 2019-04-11 ENCOUNTER — Encounter: Payer: Self-pay | Admitting: *Deleted

## 2019-04-11 ENCOUNTER — Emergency Department
Admission: EM | Admit: 2019-04-11 | Discharge: 2019-04-12 | Disposition: A | Discharge status: Home / Self Care | Payer: 59 | Attending: Emergency Medicine | Admitting: Emergency Medicine

## 2019-04-11 ENCOUNTER — Other Ambulatory Visit: Payer: Self-pay

## 2019-04-11 DIAGNOSIS — R220 Localized swelling, mass and lump, head: Secondary | ICD-10-CM | POA: Diagnosis present

## 2019-04-11 DIAGNOSIS — T7840XA Allergy, unspecified, initial encounter: Secondary | ICD-10-CM | POA: Insufficient documentation

## 2019-04-11 DIAGNOSIS — Z87891 Personal history of nicotine dependence: Secondary | ICD-10-CM | POA: Insufficient documentation

## 2019-04-11 MED ORDER — PREDNISONE 10 MG (21) PO TBPK: ORAL_TABLET | ORAL | 0 refills | Status: AC

## 2019-04-11 MED ORDER — FAMOTIDINE 20 MG PO TABS
40.0000 mg | ORAL_TABLET | Freq: Once | ORAL | Status: AC
  Administered 2019-04-11: 40 mg via ORAL
  Filled 2019-04-11: qty 2

## 2019-04-11 MED ORDER — EPINEPHRINE 0.3 MG/0.3ML IJ SOAJ: 0.3000 mg | Freq: Once | INTRAMUSCULAR | Status: DC

## 2019-04-11 MED ORDER — EPINEPHRINE 0.3 MG/0.3ML IJ SOAJ: 0.3000 mg | INTRAMUSCULAR | 1 refills | Status: AC | PRN

## 2019-04-11 MED ORDER — PREDNISONE 20 MG PO TABS
60.0000 mg | ORAL_TABLET | Freq: Once | ORAL | Status: AC
  Administered 2019-04-11: 60 mg via ORAL
  Filled 2019-04-11: qty 3

## 2019-04-11 NOTE — Discharge Instructions (Signed)
Please seek medical attention for any high fevers, chest pain, shortness of breath, change in behavior, persistent vomiting, bloody stool or any other new or concerning symptoms.  

## 2019-04-11 NOTE — ED Triage Notes (Signed)
Pt reports tongue swelling and itching of tongue.  Pt reports pain with swallowing. No rash noted.  Pt alert.

## 2019-04-11 NOTE — ED Provider Notes (Signed)
[facility] Emergency Department Provider Note _________________________________________   I have reviewed the triage vital signs and the nursing notes.   HISTORY  Chief Complaint Allergic Reaction   History limited by: Not Limited   HPI Sabrina Luna is a 38 y.o. female who presents to the emergency department today because of concerns for possible allergic reaction.  Patient thinks she was exposed to some tomatoes and a sandwich today.  She started having itchiness and pain to her mouth and tongue.  She also felt like she was having some swelling in her mouth as well. The patient tried taking some benadryl without any significant relief. The patient states that she has had reactions like this in the past. Denies any rash or skin itchiness. Denies any nausea or vomiting.   Records reviewed. Per medical record review patient has a history of ED visit for angioedema.   Past Medical History:  Diagnosis Date  . Cleft lip   . Heart murmur   . Recurrent urinary tract infection     Patient Active Problem List   Diagnosis Date Noted  . H/O cleft lip 09/21/2017  . Recurrent urinary tract infection     Past Surgical History:  Procedure Laterality Date  . ANKLE SURGERY    . CLEFT LIP REPAIR    . FACIAL RECONSTRUCTION SURGERY    . MANDIBLE SURGERY    . RECTAL SURGERY      Prior to Admission medications   Medication Sig Start Date End Date Taking? Authorizing Provider  ibuprofen (ADVIL,MOTRIN) 800 MG tablet Take 1 tablet (800 mg total) by mouth every 8 (eight) hours as needed. 09/21/18   Darci CurrentBrown, Ogden N, MD    Allergies Valentino Saxonherry, Cherry flavor, Penicillins, Tomato, Adhesive [tape], Bactrim [sulfamethoxazole-trimethoprim], Latex, and Povidone-iodine  Family History  Problem Relation Age of Onset  . Diabetes Mother   . Hypertension Mother   . Hyperlipidemia Mother   . Heart failure Mother   . Renal Disease Mother        stage 4 renal failure  .  Sarcoidosis Mother   . Asthma Mother   . Congestive Heart Failure Mother   . Colon cancer Maternal Aunt   . Prostate cancer Maternal Grandfather   . Diabetes Paternal Grandmother   . Renal Disease Paternal Grandmother   . Congestive Heart Failure Paternal Grandmother   . Breast cancer Maternal Aunt     Social History Social History   Tobacco Use  . Smoking status: Former Smoker    Types: Cigars  . Smokeless tobacco: Never Used  Substance Use Topics  . Alcohol use: Yes    Comment: occasional  . Drug use: No    Review of Systems Constitutional: No fever/chills Eyes: No visual changes. ENT: Positive for throat itchiness.  Cardiovascular: Denies chest pain. Respiratory: Denies shortness of breath. Gastrointestinal: No abdominal pain.  No nausea, no vomiting.  No diarrhea.   Genitourinary: Negative for dysuria. Musculoskeletal: Negative for back pain. Skin: Negative for rash. Neurological: Negative for headaches, focal weakness or numbness.  ____________________________________________   PHYSICAL EXAM:  VITAL SIGNS: ED Triage Vitals  Enc Vitals Group     BP 04/11/19 2044 118/68     Pulse Rate 04/11/19 2044 67     Resp 04/11/19 2044 20     Temp 04/11/19 2044 99.5 F (37.5 C)     Temp Source 04/11/19 2044 Oral     SpO2 04/11/19 2044 99 %     Weight --  Height --      Head Circumference --      Peak Flow --      Pain Score 04/11/19 2047 9   Constitutional: Alert and oriented.  Eyes: Conjunctivae are normal.  ENT      Head: Normocephalic and atraumatic.      Nose: No congestion/rhinnorhea.      Mouth/Throat: Mucous membranes are moist. No angioedema noted. No erythema.       Neck: No stridor. Hematological/Lymphatic/Immunilogical: No cervical lymphadenopathy. Cardiovascular: Normal rate, regular rhythm.  No murmurs, rubs, or gallops.  Respiratory: Normal respiratory effort without tachypnea nor retractions. Breath sounds are clear and equal bilaterally. No  wheezes/rales/rhonchi. Gastrointestinal: Soft and non tender. No rebound. No guarding.  Genitourinary: Deferred Musculoskeletal: Normal range of motion in all extremities. No lower extremity edema. Neurologic:  Normal speech and language. No gross focal neurologic deficits are appreciated.  Skin:  Skin is warm, dry and intact. No rash noted. Psychiatric: Mood and affect are normal. Speech and behavior are normal. Patient exhibits appropriate insight and judgment.  ____________________________________________    LABS (pertinent positives/negatives)  None  ____________________________________________   EKG  None  ____________________________________________    RADIOLOGY  None  ____________________________________________   PROCEDURES  Procedures  ____________________________________________   INITIAL IMPRESSION / ASSESSMENT AND PLAN / ED COURSE  Pertinent labs & imaging results that were available during my care of the patient were reviewed by me and considered in my medical decision making (see chart for details).   Patient presented to the emergency department today because of concern for possible allergic reaction. No objective findings on exam that would suggest allergic reaction, however given patient's reported symptoms she was given steroids. She was observed in the emergency department for a number of hours and did state that she felt improvement. Will plan on discharging with prescription for further steroids and epipen.   ____________________________________________   FINAL CLINICAL IMPRESSION(S) / ED DIAGNOSES  Final diagnoses:  Allergic reaction, initial encounter     Note: This dictation was prepared with Dragon dictation. Any transcriptional errors that result from this process are unintentional     Nance Pear, MD 04/12/19 [phone]
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
